# Patient Record
Sex: Male | Born: 1975 | Race: White | Hispanic: No | Marital: Married | Smoking: Never smoker
Health system: Southern US, Community
[De-identification: ages and names within clinical notes are randomized; demographics above are authoritative.]

---

## 2010-03-30 ENCOUNTER — Emergency Department (HOSPITAL_COMMUNITY)
Admission: EM | Admit: 2010-03-30 | Discharge: 2010-03-30 | Payer: Self-pay | Source: Home / Self Care | Admitting: Emergency Medicine

## 2015-06-29 ENCOUNTER — Ambulatory Visit (INDEPENDENT_AMBULATORY_CARE_PROVIDER_SITE_OTHER): Payer: Managed Care, Other (non HMO) | Admitting: Physician Assistant

## 2015-06-29 VITALS — BP 110/66 | HR 122 | Temp 99.7°F | Resp 20 | Ht 72.0 in | Wt 232.0 lb

## 2015-06-29 DIAGNOSIS — R509 Fever, unspecified: Secondary | ICD-10-CM

## 2015-06-29 DIAGNOSIS — J101 Influenza due to other identified influenza virus with other respiratory manifestations: Secondary | ICD-10-CM

## 2015-06-29 LAB — POCT CBC
Granulocyte percent: 77.7 %G (ref 37–80)
HCT, POC: 47.2 % (ref 43.5–53.7)
HEMOGLOBIN: 16.1 g/dL (ref 14.1–18.1)
LYMPH, POC: 1.1 (ref 0.6–3.4)
MCH, POC: 28.2 pg (ref 27–31.2)
MCHC: 34.2 g/dL (ref 31.8–35.4)
MCV: 82.4 fL (ref 80–97)
MID (cbc): 0.4 (ref 0–0.9)
MPV: 8.5 fL (ref 0–99.8)
PLATELET COUNT, POC: 141 10*3/uL — AB (ref 142–424)
POC Granulocyte: 5.3 (ref 2–6.9)
POC LYMPH %: 15.7 % (ref 10–50)
POC MID %: 6.6 %M (ref 0–12)
RBC: 5.72 M/uL (ref 4.69–6.13)
RDW, POC: 13.1 %
WBC: 6.8 10*3/uL (ref 4.6–10.2)

## 2015-06-29 LAB — POCT INFLUENZA A/B
INFLUENZA A, POC: NEGATIVE
INFLUENZA B, POC: POSITIVE — AB

## 2015-06-29 MED ORDER — BENZONATATE 100 MG PO CAPS: 100.0000 mg | ORAL_CAPSULE | Freq: Three times a day (TID) | ORAL | Status: AC | PRN

## 2015-06-29 MED ORDER — HYDROCOD POLST-CPM POLST ER 10-8 MG/5ML PO SUER: 5.0000 mL | Freq: Two times a day (BID) | ORAL | Status: AC | PRN

## 2015-06-29 MED ORDER — IPRATROPIUM BROMIDE 0.03 % NA SOLN: 2.0000 | Freq: Two times a day (BID) | NASAL | Status: AC

## 2015-06-29 NOTE — Progress Notes (Signed)
[facility] [address] [place] [address] [phone]  Date:  06/29/2015   Name:  Don White   DOB:  12-27-1975   MRN:  [id number]  PCP:  No primary care provider on file.    Chief Complaint: Cough; Chills; and Sinus Problem   History of Present Illness:  This is a 40 y.o. male who is presenting 4 days of sore throat, nasal congestion, body aches, cough. Having low grade fever and chills. No otalgia, sob, wheezing.  Aggravating/alleviating factors: taking nyquil and not helping much History of asthma: no History of env allergies: no Tobacco use: no Did not get flu shot this year.   Review of Systems:  Review of Systems See HPI There are no active problems to display for this patient.   Prior to Admission medications   Not on File    Not on File  History reviewed. No pertinent past surgical history.  Social History  Substance Use Topics  . Smoking status: Never Smoker   . Smokeless tobacco: None  . Alcohol Use: None    History reviewed. No pertinent family history.  Medication list has been reviewed and updated.  Physical Examination:  Physical Exam  Constitutional: He is oriented to person, place, and time. He appears well-developed and well-nourished. No distress.  HENT:  Head: Normocephalic and atraumatic.  Right Ear: Hearing, tympanic membrane, external ear and ear canal normal.  Left Ear: Hearing, tympanic membrane, external ear and ear canal normal.  Nose: Nose normal.  Mouth/Throat: Uvula is midline and mucous membranes are normal. Posterior oropharyngeal erythema present. No oropharyngeal exudate or posterior oropharyngeal edema.  Eyes: Conjunctivae and lids are normal. Right eye exhibits no discharge. Left eye exhibits no discharge. No scleral icterus.  Cardiovascular: Regular rhythm, normal heart sounds and normal pulses.  Tachycardia present.   No murmur heard. Pulmonary/Chest: Effort normal and breath sounds  normal. No respiratory distress. He has no wheezes. He has no rhonchi. He has no rales.  Musculoskeletal: Normal range of motion.  Lymphadenopathy:       Head (right side): No submental, no submandibular and no tonsillar adenopathy present.       Head (left side): No submental, no submandibular and no tonsillar adenopathy present.    He has no cervical adenopathy.  Neurological: He is alert and oriented to person, place, and time.  Skin: Skin is warm, dry and intact. No lesion and no rash noted.  Psychiatric: He has a normal mood and affect. His speech is normal and behavior is normal. Thought content normal.    BP 110/66 mmHg  Pulse 122  Temp(Src) 99.7 F (37.6 C) (Oral)  Resp 20  Ht 6' (1.829 m)  Wt 232 lb (105.235 kg)  BMI 31.46 kg/m2  SpO2 98%  Results for orders placed or performed in visit on 06/29/15  POCT CBC  Result Value Ref Range   WBC 6.8 4.6 - 10.2 K/uL   Lymph, poc 1.1 0.6 - 3.4   POC LYMPH PERCENT 15.7 10 - 50 %L   MID (cbc) 0.4 0 - 0.9   POC MID % 6.6 0 - 12 %M   POC Granulocyte 5.3 2 - 6.9   Granulocyte percent 77.7 37 - 80 %G   RBC 5.72 4.69 - 6.13 M/uL   Hemoglobin 16.1 14.1 - 18.1 g/dL   HCT, POC 29.5 62.1 - 53.7 %   MCV 82.4 80 - 97 fL   MCH, POC 28.2 27 -  31.2 pg   MCHC 34.2 31.8 - 35.4 g/dL   RDW, POC 16.1 %   Platelet Count, POC 141 (A) 142 - 424 K/uL   MPV 8.5 0 - 99.8 fL  POCT Influenza A/B  Result Value Ref Range   Influenza A, POC Negative Negative   Influenza B, POC Positive (A) Negative   Assessment and Plan:  1. Fever, unspecified 2. Influenza B Positive flu. CBC wnl. Out of window for tamiflu. Focus is on supportive care, see meds prescribed below. Return in 1 week if symptoms do not improve or at any time if symptoms worsen.  - POCT CBC - POCT Influenza A/B - benzonatate (TESSALON) 100 MG capsule; Take 1-2 capsules (100-200 mg total) by mouth 3 (three) times daily as needed for cough.  Dispense: 40 capsule; Refill: 0 -  chlorpheniramine-HYDROcodone (TUSSIONEX PENNKINETIC ER) 10-8 MG/5ML SUER; Take 5 mLs by mouth every 12 (twelve) hours as needed for cough.  Dispense: 100 mL; Refill: 0 - ipratropium (ATROVENT) 0.03 % nasal spray; Place 2 sprays into both nostrils 2 (two) times daily.  Dispense: 30 mL; Refill: 0   Nicole V. Dyke Brackett, MHS [facility]  06/29/2015

## 2015-06-29 NOTE — Patient Instructions (Signed)
Drink plenty of water (64 oz/day) and get plenty of rest. If you have been prescribed a cough syrup, do not drive or operate heavy machinery while using this medication. If you have been prescribed a nasal spray, use twice a day. Take tessalon during the day for cough If your symptoms are not improving in 1 week or if at any time your symptoms worsen, return to clinic.

## 2015-07-29 ENCOUNTER — Other Ambulatory Visit: Payer: Self-pay | Admitting: Physician Assistant

## 2021-01-13 ENCOUNTER — Other Ambulatory Visit: Payer: Self-pay | Admitting: Family Medicine

## 2021-01-13 DIAGNOSIS — R202 Paresthesia of skin: Secondary | ICD-10-CM

## 2021-02-07 ENCOUNTER — Ambulatory Visit
Admission: RE | Admit: 2021-02-07 | Discharge: 2021-02-07 | Disposition: A | Discharge status: Home / Self Care | Payer: Managed Care, Other (non HMO) | Source: Ambulatory Visit | Attending: Family Medicine | Admitting: Family Medicine

## 2021-02-07 DIAGNOSIS — R202 Paresthesia of skin: Secondary | ICD-10-CM

## 2022-08-27 IMAGING — MR MR LUMBAR SPINE W/O CM
4 of 5 series · 27 of 48 positions shown · non-contrast
Comparison: None.

CLINICAL DATA: Low back pain, bilateral buttock pain, right leg
numbness

EXAM:
MRI LUMBAR SPINE WITHOUT CONTRAST
TECHNIQUE: Multiplanar, multisequence MR imaging of the lumbar spine was
performed. No intravenous contrast was administered.

[Series 3: T2 · sagittal · 4.0mm · 0.53mm/px · 6 of 15 slices shown (1 of 2)]
[im 1/15]
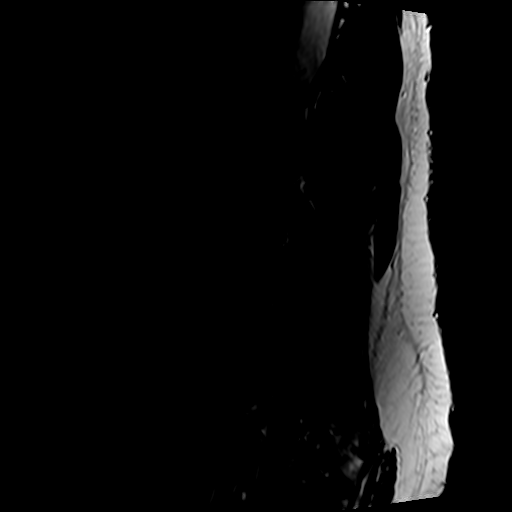
[im 3/15]
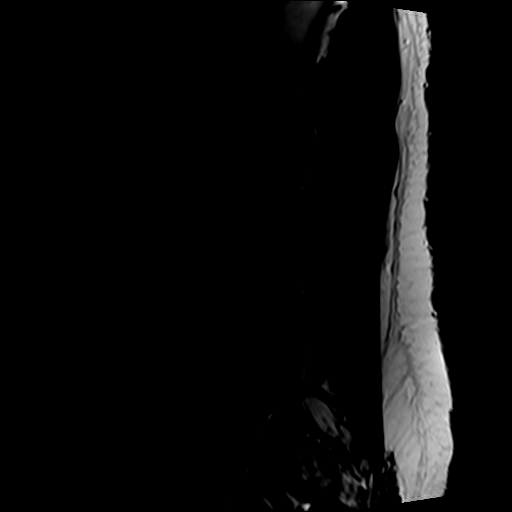
[im 6/15]
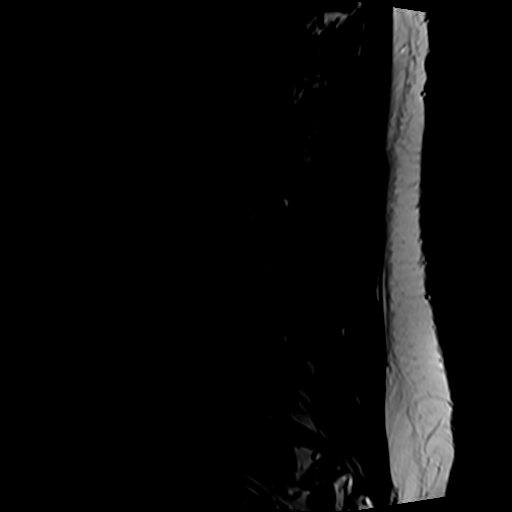
[im 9/15]
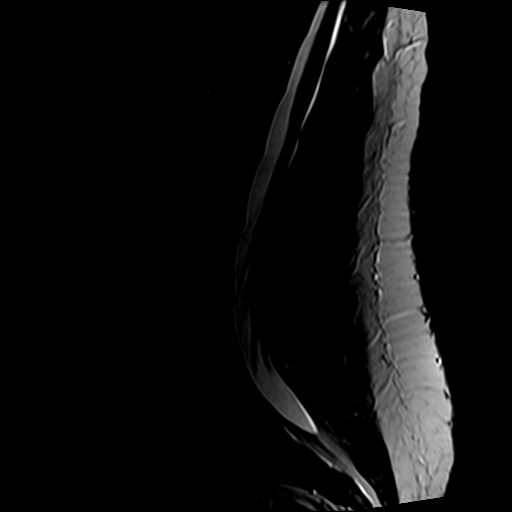
[im 12/15]
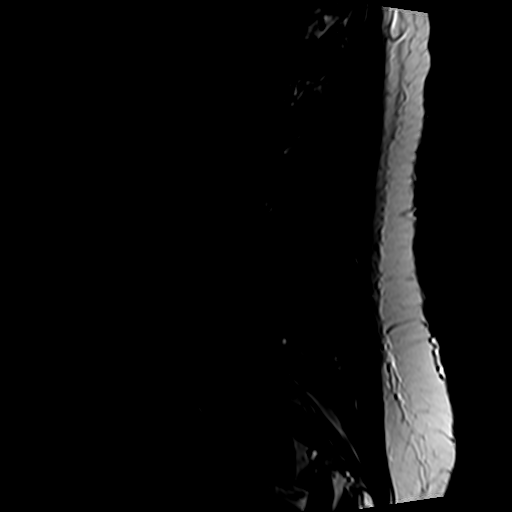
[im 15/15]
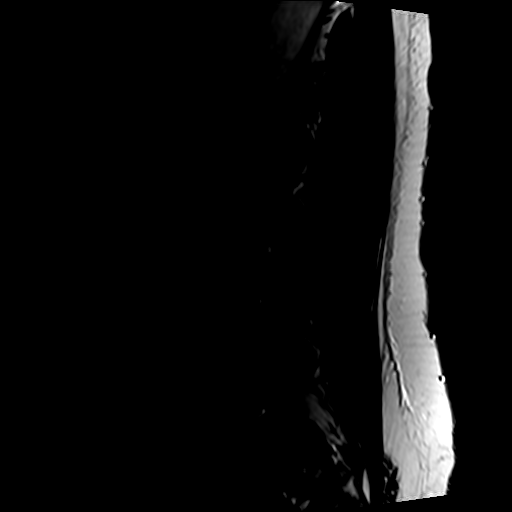

[Series 5: T1 · sagittal · 4.0mm · 0.53mm/px · 5 of 15 slices shown (1 of 2)]
[im 1/15]
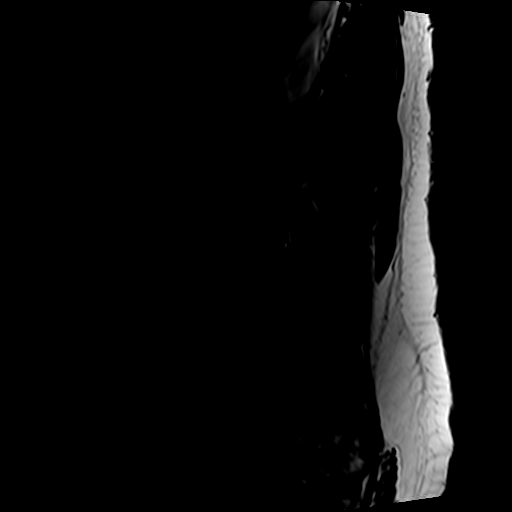
[im 4/15]
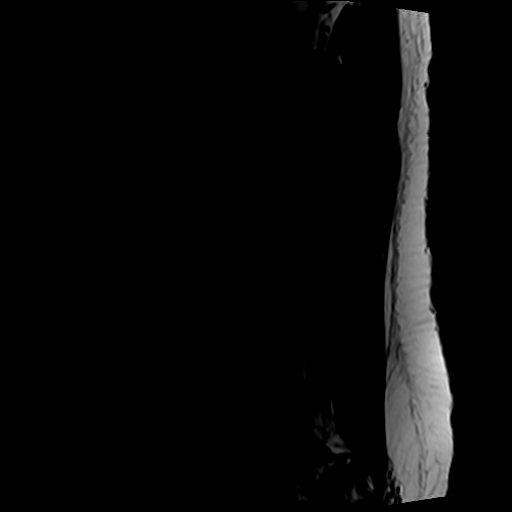
[im 8/15]
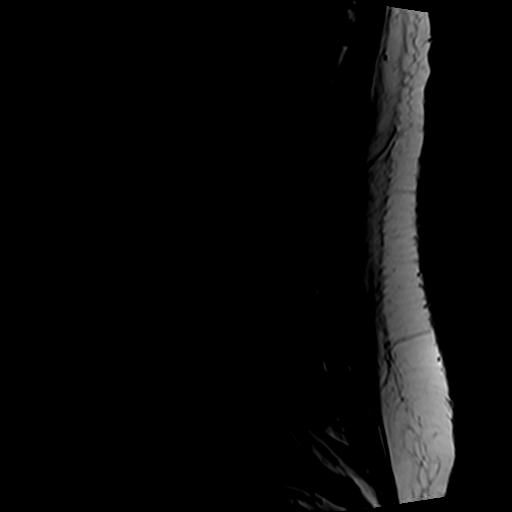
[im 11/15]
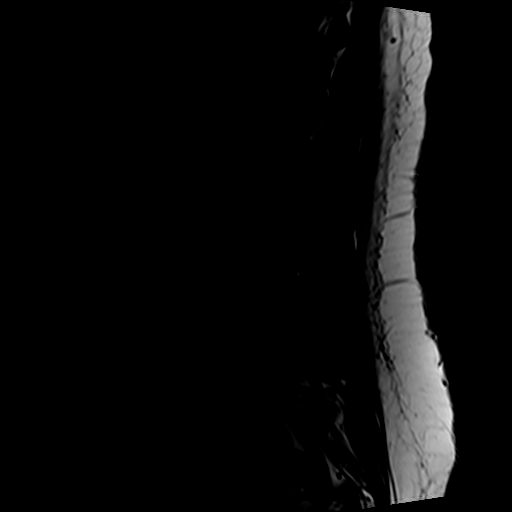
[im 15/15]
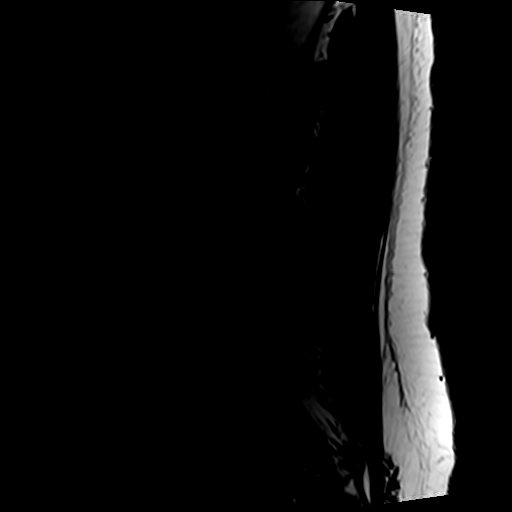

[Series 6: T2 · axial · 4.0mm · 0.70mm/px · z∈[-149,+71]mm · 10 of 43 slices shown (2 of 2)]
[im 3/43]
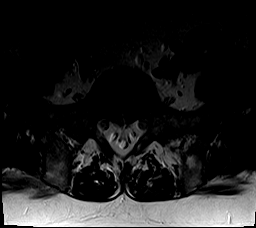
[im 6/43]
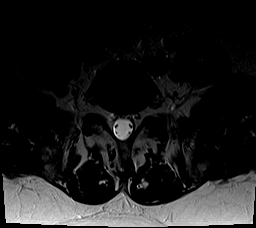
[im 9/43]
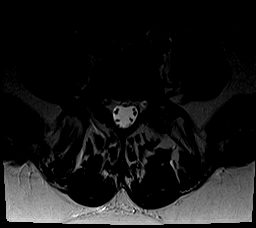
[im 15/43]
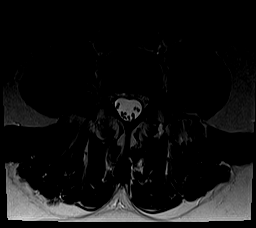
[im 20/43]
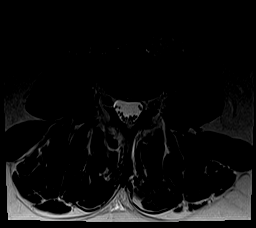
[im 23/43]
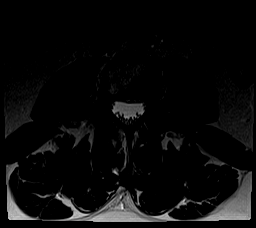
[im 26/43]
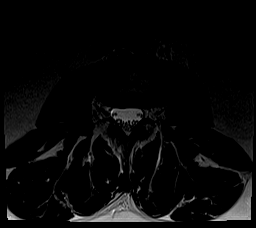
[im 31/43]
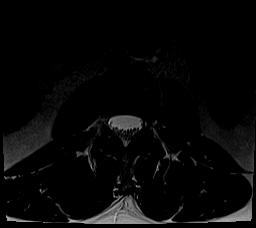
[im 37/43]
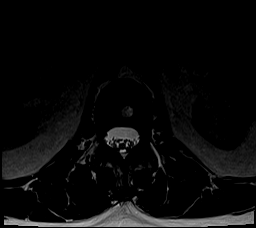
[im 43/43]
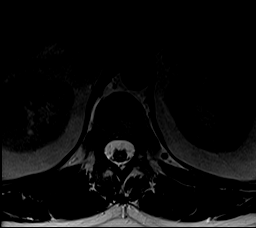

[Series 7: T1 · axial · 4.0mm · 0.35mm/px · z∈[-149,+40]mm · 6 of 43 slices shown (2 of 2)]
[im 3/43]
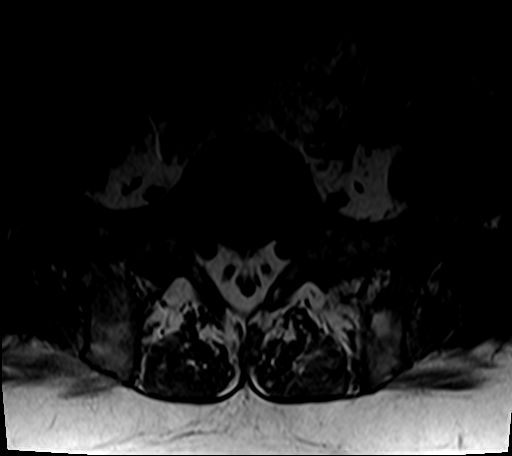
[im 6/43]
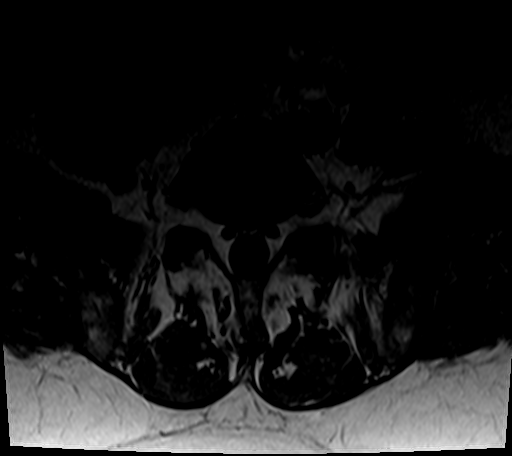
[im 9/43]
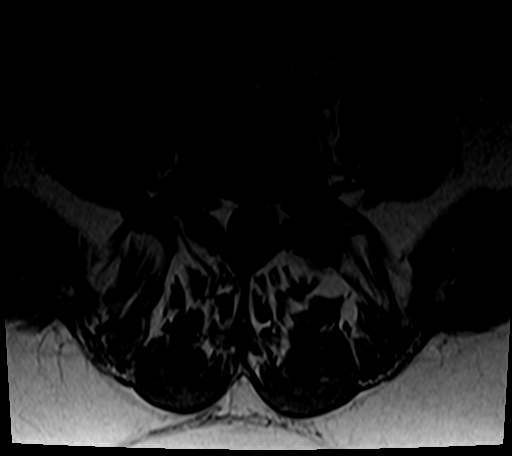
[im 15/43]
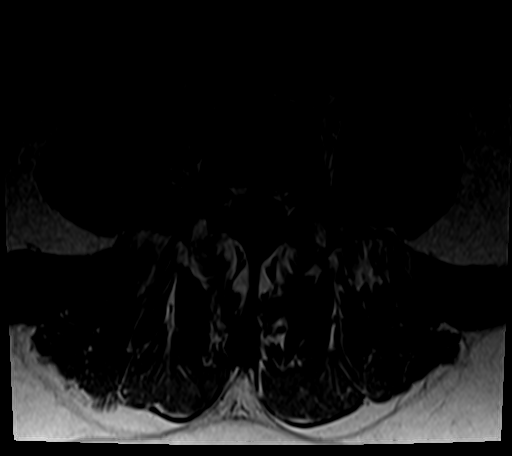
[im 23/43]
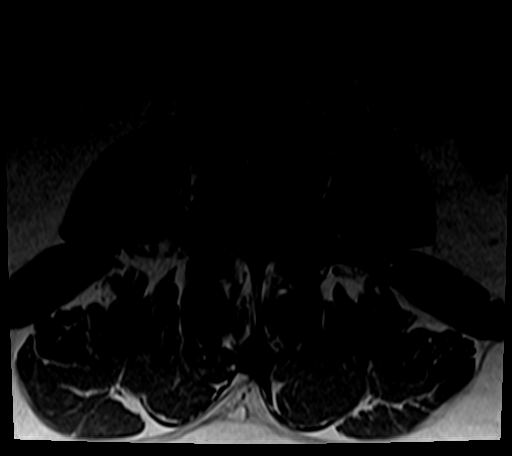
[im 37/43]
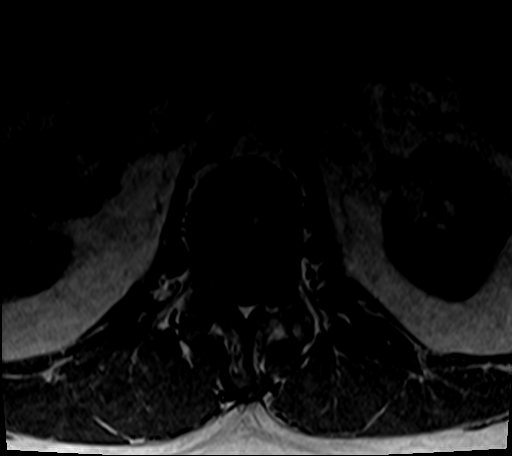

[27 of 48 positions shown; findings below may reference images not displayed]

FINDINGS: Segmentation:  Standard.

Alignment:  Physiologic.

Vertebrae: No acute fracture, evidence of discitis, or suspicious
osseous lesion. Multiple T1 and T2 hyperintense lesions in the
vertebral bodies, the largest of which is in L3, consistent with
benign hemangiomas.

Conus medullaris and cauda equina: Conus extends to the L1 level.
Conus and cauda equina appear normal.

Paraspinal and other soft tissues: Negative.

Disc levels:

T12-L1: No significant disc bulge. No spinal canal stenosis or
neural foraminal narrowing.

L1-L2: No significant disc bulge. No spinal canal stenosis or neural
foraminal narrowing.

L2-L3: No significant disc bulge. No spinal canal stenosis or neural
foraminal narrowing.

L3-L4: No significant disc bulge. Mild facet arthropathy. No spinal
canal stenosis or neural foraminal narrowing.

L4-L5: No significant disc bulge. Moderate facet arthropathy. No
spinal canal stenosis or neural foraminal narrowing.

L5-S1: No significant disc bulge. Severe left and moderate right
facet arthropathy. No spinal canal stenosis or neural foraminal
narrowing.

Degenerative changes in the bilateral sacroiliac joints,
incompletely evaluated.
IMPRESSION: 1. Facet arthropathy in the lower lumbar spine, worse at L5-S1
tunneled which can cause pain.
2. No spinal canal stenosis or neural foraminal narrowing.
